# Patient Record
Sex: Male | Born: 1995
Health system: Southern US, Community
[De-identification: ages and names within clinical notes are randomized; demographics above are authoritative.]

---

## 2019-10-10 ENCOUNTER — Inpatient Hospital Stay (HOSPITAL_COMMUNITY)
Admission: EM | Admit: 2019-10-10 | Discharge: 2019-10-15 | DRG: 200 | Disposition: A | Discharge status: Home / Self Care | Payer: Self-pay | Attending: General Surgery | Admitting: General Surgery

## 2019-10-10 ENCOUNTER — Emergency Department (HOSPITAL_COMMUNITY): Payer: Self-pay

## 2019-10-10 DIAGNOSIS — J90 Pleural effusion, not elsewhere classified: Secondary | ICD-10-CM | POA: Diagnosis present

## 2019-10-10 DIAGNOSIS — T1490XA Injury, unspecified, initial encounter: Secondary | ICD-10-CM

## 2019-10-10 DIAGNOSIS — Q211 Atrial septal defect: Secondary | ICD-10-CM

## 2019-10-10 DIAGNOSIS — S21131A Puncture wound without foreign body of right front wall of thorax without penetration into thoracic cavity, initial encounter: Secondary | ICD-10-CM | POA: Diagnosis present

## 2019-10-10 DIAGNOSIS — I959 Hypotension, unspecified: Secondary | ICD-10-CM | POA: Diagnosis present

## 2019-10-10 DIAGNOSIS — Z20828 Contact with and (suspected) exposure to other viral communicable diseases: Secondary | ICD-10-CM | POA: Diagnosis present

## 2019-10-10 DIAGNOSIS — J939 Pneumothorax, unspecified: Secondary | ICD-10-CM

## 2019-10-10 DIAGNOSIS — J942 Hemothorax: Secondary | ICD-10-CM

## 2019-10-10 DIAGNOSIS — R0902 Hypoxemia: Secondary | ICD-10-CM | POA: Diagnosis present

## 2019-10-10 DIAGNOSIS — E669 Obesity, unspecified: Secondary | ICD-10-CM | POA: Diagnosis present

## 2019-10-10 DIAGNOSIS — S2241XA Multiple fractures of ribs, right side, initial encounter for closed fracture: Secondary | ICD-10-CM | POA: Diagnosis present

## 2019-10-10 DIAGNOSIS — W3400XA Accidental discharge from unspecified firearms or gun, initial encounter: Secondary | ICD-10-CM

## 2019-10-10 DIAGNOSIS — Z23 Encounter for immunization: Secondary | ICD-10-CM

## 2019-10-10 DIAGNOSIS — D62 Acute posthemorrhagic anemia: Secondary | ICD-10-CM | POA: Diagnosis present

## 2019-10-10 DIAGNOSIS — S271XXA Traumatic hemothorax, initial encounter: Secondary | ICD-10-CM

## 2019-10-10 DIAGNOSIS — S272XXA Traumatic hemopneumothorax, initial encounter: Principal | ICD-10-CM | POA: Diagnosis present

## 2019-10-10 LAB — CBC
HCT: 31 % — ABNORMAL LOW (ref 39.0–52.0)
Hemoglobin: 10 g/dL — ABNORMAL LOW (ref 13.0–17.0)
MCH: 29.3 pg (ref 26.0–34.0)
MCHC: 32.3 g/dL (ref 30.0–36.0)
MCV: 90.9 fL (ref 80.0–100.0)
Platelets: 237 10*3/uL (ref 150–400)
RBC: 3.41 MIL/uL — ABNORMAL LOW (ref 4.22–5.81)
RDW: 12.3 % (ref 11.5–15.5)
WBC: 11.7 10*3/uL — ABNORMAL HIGH (ref 4.0–10.5)
nRBC: 0 % (ref 0.0–0.2)

## 2019-10-10 LAB — I-STAT CHEM 8, ED
BUN: 16 mg/dL (ref 6–20)
Calcium, Ion: 1.18 mmol/L (ref 1.15–1.40)
Chloride: 102 mmol/L (ref 98–111)
Creatinine, Ser: 1.3 mg/dL — ABNORMAL HIGH (ref 0.61–1.24)
Glucose, Bld: 156 mg/dL — ABNORMAL HIGH (ref 70–99)
HCT: 31 % — ABNORMAL LOW (ref 39.0–52.0)
Hemoglobin: 10.5 g/dL — ABNORMAL LOW (ref 13.0–17.0)
Potassium: 3.7 mmol/L (ref 3.5–5.1)
Sodium: 140 mmol/L (ref 135–145)
TCO2: 25 mmol/L (ref 22–32)

## 2019-10-10 LAB — COMPREHENSIVE METABOLIC PANEL
ALT: 24 U/L (ref 0–44)
AST: 25 U/L (ref 15–41)
Albumin: 3.8 g/dL (ref 3.5–5.0)
Alkaline Phosphatase: 40 U/L (ref 38–126)
Anion gap: 11 (ref 5–15)
BUN: 13 mg/dL (ref 6–20)
CO2: 22 mmol/L (ref 22–32)
Calcium: 8.6 mg/dL — ABNORMAL LOW (ref 8.9–10.3)
Chloride: 106 mmol/L (ref 98–111)
Creatinine, Ser: 1.41 mg/dL — ABNORMAL HIGH (ref 0.61–1.24)
GFR calc Af Amer: >60 mL/min (ref >60)
GFR calc non Af Amer: >60 mL/min (ref >60)
Glucose, Bld: 169 mg/dL — ABNORMAL HIGH (ref 70–99)
Potassium: 3.7 mmol/L (ref 3.5–5.1)
Sodium: 139 mmol/L (ref 135–145)
Total Bilirubin: 0.8 mg/dL (ref 0.3–1.2)
Total Protein: 5.9 g/dL — ABNORMAL LOW (ref 6.5–8.1)

## 2019-10-10 LAB — PROTIME-INR
INR: 1.3 — ABNORMAL HIGH (ref 0.8–1.2)
Prothrombin Time: 16.2 seconds — ABNORMAL HIGH (ref 11.4–15.2)

## 2019-10-10 LAB — LACTIC ACID, PLASMA: Lactic Acid, Venous: 3.9 mmol/L (ref 0.5–1.9)

## 2019-10-10 LAB — ABO/RH: ABO/RH(D): O POS

## 2019-10-10 LAB — SARS CORONAVIRUS 2 BY RT PCR (HOSPITAL ORDER, PERFORMED IN ~~LOC~~ HOSPITAL LAB): SARS Coronavirus 2: NEGATIVE

## 2019-10-10 LAB — ETHANOL: Alcohol, Ethyl (B): <10 mg/dL (ref <10)

## 2019-10-10 MED ORDER — HYDROMORPHONE HCL 1 MG/ML IJ SOLN
INTRAMUSCULAR | Status: AC
  Filled 2019-10-10: qty 1

## 2019-10-10 MED ORDER — CEFAZOLIN SODIUM-DEXTROSE 2-4 GM/100ML-% IV SOLN
2.0000 g | Freq: Once | INTRAVENOUS | Status: AC
  Administered 2019-10-10: 2 g via INTRAVENOUS

## 2019-10-10 MED ORDER — IOHEXOL 300 MG/ML  SOLN
100.0000 mL | Freq: Once | INTRAMUSCULAR | Status: AC | PRN
  Administered 2019-10-10: 100 mL via INTRAVENOUS

## 2019-10-10 MED ORDER — ONDANSETRON HCL 4 MG/2ML IJ SOLN
INTRAMUSCULAR | Status: AC
  Administered 2019-10-10: 4 mg
  Filled 2019-10-10: qty 2

## 2019-10-10 MED ORDER — CEFAZOLIN SODIUM-DEXTROSE 1-4 GM/50ML-% IV SOLN: 1.0000 g | Freq: Once | INTRAVENOUS | Status: DC

## 2019-10-10 MED ORDER — HYDROMORPHONE HCL 1 MG/ML IJ SOLN
1.0000 mg | Freq: Once | INTRAMUSCULAR | Status: AC
  Administered 2019-10-10: 1 mg via INTRAVENOUS
  Filled 2019-10-10: qty 1

## 2019-10-10 MED ORDER — FENTANYL CITRATE (PF) 100 MCG/2ML IJ SOLN
50.0000 ug | Freq: Once | INTRAMUSCULAR | Status: AC | PRN
  Administered 2019-10-10: 50 ug via INTRAVENOUS

## 2019-10-10 MED ORDER — HYDROMORPHONE HCL 1 MG/ML IJ SOLN
INTRAMUSCULAR | Status: AC | PRN
  Administered 2019-10-10: 1 mg via INTRAVENOUS

## 2019-10-10 MED ORDER — FENTANYL CITRATE (PF) 100 MCG/2ML IJ SOLN
INTRAMUSCULAR | Status: AC
  Filled 2019-10-10: qty 2

## 2019-10-10 MED ORDER — FENTANYL CITRATE (PF) 100 MCG/2ML IJ SOLN
25.0000 ug | INTRAMUSCULAR | Status: DC | PRN
  Administered 2019-10-11 (×4): 50 ug via INTRAVENOUS
  Filled 2019-10-10 (×3): qty 2

## 2019-10-10 MED ORDER — TETANUS-DIPHTH-ACELL PERTUSSIS 5-2.5-18.5 LF-MCG/0.5 IM SUSP
0.5000 mL | Freq: Once | INTRAMUSCULAR | Status: AC
  Administered 2019-10-10: 0.5 mL via INTRAMUSCULAR

## 2019-10-10 MED ORDER — SODIUM CHLORIDE 0.9 % IV SOLN
INTRAVENOUS | Status: AC | PRN
  Administered 2019-10-10 (×2): 1000 mL via INTRAVENOUS

## 2019-10-10 NOTE — ED Notes (Signed)
Patient complaining of pain in lungs.  Patient to be medicated per orders.
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
## 2019-10-10 NOTE — ED Notes (Signed)
Patient vomited large amount of brown, food filled emesis.  Airway suctioned of emesis.  VO of 4mg  of Zofran by Dr Redmond Pulling.
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
## 2019-10-11 ENCOUNTER — Encounter (HOSPITAL_COMMUNITY): Payer: Self-pay

## 2019-10-11 ENCOUNTER — Other Ambulatory Visit: Payer: Self-pay

## 2019-10-11 ENCOUNTER — Inpatient Hospital Stay (HOSPITAL_COMMUNITY): Payer: Self-pay

## 2019-10-11 DIAGNOSIS — W3400XA Accidental discharge from unspecified firearms or gun, initial encounter: Secondary | ICD-10-CM

## 2019-10-11 DIAGNOSIS — S279XXA Injury of unspecified intrathoracic organ, initial encounter: Secondary | ICD-10-CM

## 2019-10-11 LAB — BPAM RBC
Blood Product Expiration Date: 202011082359
ISSUE DATE / TIME: 202010152221
Unit Type and Rh: 5100

## 2019-10-11 LAB — CBC
HCT: 28.6 % — ABNORMAL LOW (ref 39.0–52.0)
HCT: 25.8 % — ABNORMAL LOW (ref 39.0–52.0)
Hemoglobin: 9.8 g/dL — ABNORMAL LOW (ref 13.0–17.0)
Hemoglobin: 9.1 g/dL — ABNORMAL LOW (ref 13.0–17.0)
MCH: 30.1 pg (ref 26.0–34.0)
MCH: 30.1 pg (ref 26.0–34.0)
MCHC: 34.3 g/dL (ref 30.0–36.0)
MCHC: 35.3 g/dL (ref 30.0–36.0)
MCV: 87.7 fL (ref 80.0–100.0)
MCV: 85.4 fL (ref 80.0–100.0)
Platelets: 196 10*3/uL (ref 150–400)
Platelets: 167 10*3/uL (ref 150–400)
RBC: 3.26 MIL/uL — ABNORMAL LOW (ref 4.22–5.81)
RBC: 3.02 MIL/uL — ABNORMAL LOW (ref 4.22–5.81)
RDW: 12.7 % (ref 11.5–15.5)
RDW: 12.9 % (ref 11.5–15.5)
WBC: 22.8 10*3/uL — ABNORMAL HIGH (ref 4.0–10.5)
WBC: 12.9 10*3/uL — ABNORMAL HIGH (ref 4.0–10.5)
nRBC: 0 % (ref 0.0–0.2)
nRBC: 0 % (ref 0.0–0.2)

## 2019-10-11 LAB — COMPREHENSIVE METABOLIC PANEL
ALT: 23 U/L (ref 0–44)
AST: 34 U/L (ref 15–41)
Albumin: 3.4 g/dL — ABNORMAL LOW (ref 3.5–5.0)
Alkaline Phosphatase: 35 U/L — ABNORMAL LOW (ref 38–126)
Anion gap: 7 (ref 5–15)
BUN: 14 mg/dL (ref 6–20)
CO2: 23 mmol/L (ref 22–32)
Calcium: 7.8 mg/dL — ABNORMAL LOW (ref 8.9–10.3)
Chloride: 109 mmol/L (ref 98–111)
Creatinine, Ser: 1.11 mg/dL (ref 0.61–1.24)
GFR calc Af Amer: >60 mL/min (ref >60)
GFR calc non Af Amer: >60 mL/min (ref >60)
Glucose, Bld: 124 mg/dL — ABNORMAL HIGH (ref 70–99)
Potassium: 4.4 mmol/L (ref 3.5–5.1)
Sodium: 139 mmol/L (ref 135–145)
Total Bilirubin: 1.1 mg/dL (ref 0.3–1.2)
Total Protein: 5.2 g/dL — ABNORMAL LOW (ref 6.5–8.1)

## 2019-10-11 LAB — URINALYSIS, ROUTINE W REFLEX MICROSCOPIC
Bilirubin Urine: NEGATIVE
Glucose, UA: NEGATIVE mg/dL
Hgb urine dipstick: NEGATIVE
Ketones, ur: NEGATIVE mg/dL
Leukocytes,Ua: NEGATIVE
Nitrite: NEGATIVE
Protein, ur: NEGATIVE mg/dL
Specific Gravity, Urine: 1.034 — ABNORMAL HIGH (ref 1.005–1.030)
pH: 5 (ref 5.0–8.0)

## 2019-10-11 LAB — PROTIME-INR
INR: 1.3 — ABNORMAL HIGH (ref 0.8–1.2)
Prothrombin Time: 16.2 seconds — ABNORMAL HIGH (ref 11.4–15.2)

## 2019-10-11 LAB — TYPE AND SCREEN
ABO/RH(D): O POS
Antibody Screen: NEGATIVE
Unit division: 0

## 2019-10-11 LAB — MRSA PCR SCREENING: MRSA by PCR: NEGATIVE

## 2019-10-11 LAB — HIV ANTIBODY (ROUTINE TESTING W REFLEX): HIV Screen 4th Generation wRfx: NONREACTIVE

## 2019-10-11 LAB — APTT: aPTT: 24 seconds (ref 24–36)

## 2019-10-11 LAB — BLOOD PRODUCT ORDER (VERBAL) VERIFICATION

## 2019-10-11 MED ORDER — METHOCARBAMOL 1000 MG/10ML IJ SOLN
1000.0000 mg | Freq: Three times a day (TID) | INTRAVENOUS | Status: DC | PRN
  Filled 2019-10-11 (×2): qty 10

## 2019-10-11 MED ORDER — ONDANSETRON HCL 4 MG/2ML IJ SOLN
4.0000 mg | Freq: Four times a day (QID) | INTRAMUSCULAR | Status: DC | PRN
  Administered 2019-10-12 (×2): 4 mg via INTRAVENOUS
  Filled 2019-10-11 (×2): qty 2

## 2019-10-11 MED ORDER — ACETAMINOPHEN 325 MG PO TABS
650.0000 mg | ORAL_TABLET | Freq: Four times a day (QID) | ORAL | Status: DC
  Administered 2019-10-11 – 2019-10-15 (×15): 650 mg via ORAL
  Filled 2019-10-11 (×16): qty 2

## 2019-10-11 MED ORDER — ONDANSETRON 4 MG PO TBDP: 4.0000 mg | ORAL_TABLET | Freq: Four times a day (QID) | ORAL | Status: DC | PRN

## 2019-10-11 MED ORDER — PANTOPRAZOLE SODIUM 40 MG IV SOLR: 40.0000 mg | Freq: Every day | INTRAVENOUS | Status: DC

## 2019-10-11 MED ORDER — HYDROMORPHONE HCL 1 MG/ML IJ SOLN
1.0000 mg | INTRAMUSCULAR | Status: DC | PRN
  Administered 2019-10-11 – 2019-10-12 (×3): 1 mg via INTRAVENOUS
  Filled 2019-10-11 (×3): qty 1

## 2019-10-11 MED ORDER — PANTOPRAZOLE SODIUM 40 MG PO TBEC
40.0000 mg | DELAYED_RELEASE_TABLET | Freq: Every day | ORAL | Status: DC
  Administered 2019-10-11 – 2019-10-15 (×5): 40 mg via ORAL
  Filled 2019-10-11 (×5): qty 1

## 2019-10-11 MED ORDER — OXYCODONE HCL 5 MG PO TABS
5.0000 mg | ORAL_TABLET | ORAL | Status: DC | PRN
  Administered 2019-10-11 (×2): 5 mg via ORAL
  Administered 2019-10-11 – 2019-10-14 (×8): 10 mg via ORAL
  Filled 2019-10-11: qty 2
  Filled 2019-10-11: qty 1
  Filled 2019-10-11: qty 2
  Filled 2019-10-11: qty 1
  Filled 2019-10-11 (×7): qty 2

## 2019-10-11 MED ORDER — CHLORHEXIDINE GLUCONATE CLOTH 2 % EX PADS
6.0000 | MEDICATED_PAD | Freq: Every day | CUTANEOUS | Status: DC
  Administered 2019-10-11 – 2019-10-14 (×4): 6 via TOPICAL

## 2019-10-11 MED ORDER — POTASSIUM CHLORIDE IN NACL 20-0.9 MEQ/L-% IV SOLN
INTRAVENOUS | Status: DC
  Administered 2019-10-11 (×2): via INTRAVENOUS
  Filled 2019-10-11 (×2): qty 1000
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
## 2019-10-11 NOTE — Progress Notes (Signed)
Patient's belongings upon admission consisted of 1 cell phone.

## 2019-10-11 NOTE — Progress Notes (Signed)
Assisted tele visit to patient with family member.  Bartholomew Ramesh Anderson, RN   

## 2019-10-11 NOTE — Plan of Care (Signed)
Pt alert and oriented, complaints of pain but able to tolerate.  Will continue to monitor  Hiram Gash RN
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
## 2019-10-12 ENCOUNTER — Inpatient Hospital Stay (HOSPITAL_COMMUNITY): Payer: Self-pay

## 2019-10-12 LAB — CBC
HCT: 24.6 % — ABNORMAL LOW (ref 39.0–52.0)
Hemoglobin: 8.5 g/dL — ABNORMAL LOW (ref 13.0–17.0)
MCH: 29.9 pg (ref 26.0–34.0)
MCHC: 34.6 g/dL (ref 30.0–36.0)
MCV: 86.6 fL (ref 80.0–100.0)
Platelets: 168 10*3/uL (ref 150–400)
RBC: 2.84 MIL/uL — ABNORMAL LOW (ref 4.22–5.81)
RDW: 12.7 % (ref 11.5–15.5)
WBC: 12.4 10*3/uL — ABNORMAL HIGH (ref 4.0–10.5)
nRBC: 0 % (ref 0.0–0.2)
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
## 2019-10-12 NOTE — Progress Notes (Signed)
Assisted tele visit to patient with family member.  Shine Scrogham P, RN  
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
## 2019-10-12 NOTE — Progress Notes (Signed)
Assisted tele visit to patient with family member.  Jorge Greer P, RN  

## 2019-10-13 ENCOUNTER — Inpatient Hospital Stay (HOSPITAL_COMMUNITY): Payer: Self-pay

## 2019-10-13 LAB — CBC
HCT: 24.2 % — ABNORMAL LOW (ref 39.0–52.0)
Hemoglobin: 8.5 g/dL — ABNORMAL LOW (ref 13.0–17.0)
MCH: 30.2 pg (ref 26.0–34.0)
MCHC: 35.1 g/dL (ref 30.0–36.0)
MCV: 86.1 fL (ref 80.0–100.0)
Platelets: 173 10*3/uL (ref 150–400)
RBC: 2.81 MIL/uL — ABNORMAL LOW (ref 4.22–5.81)
RDW: 12.2 % (ref 11.5–15.5)
WBC: 11 10*3/uL — ABNORMAL HIGH (ref 4.0–10.5)
nRBC: 0 % (ref 0.0–0.2)
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
## 2019-10-13 NOTE — Progress Notes (Signed)
Assisted tele visit to patient with family member.  Daryus Sowash P, RN  

## 2019-10-13 NOTE — Progress Notes (Signed)
Assisted tele visit to patient with family member.  Jorge Greer P, RN  
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
## 2019-10-14 ENCOUNTER — Inpatient Hospital Stay (HOSPITAL_COMMUNITY): Payer: Self-pay

## 2019-10-14 LAB — CBC
HCT: 23.4 % — ABNORMAL LOW (ref 39.0–52.0)
Hemoglobin: 8.2 g/dL — ABNORMAL LOW (ref 13.0–17.0)
MCH: 29.9 pg (ref 26.0–34.0)
MCHC: 35 g/dL (ref 30.0–36.0)
MCV: 85.4 fL (ref 80.0–100.0)
Platelets: 219 10*3/uL (ref 150–400)
RBC: 2.74 MIL/uL — ABNORMAL LOW (ref 4.22–5.81)
RDW: 11.9 % (ref 11.5–15.5)
WBC: 9.1 10*3/uL (ref 4.0–10.5)
nRBC: 0 % (ref 0.0–0.2)

## 2019-10-14 LAB — BASIC METABOLIC PANEL
Anion gap: 9 (ref 5–15)
BUN: 11 mg/dL (ref 6–20)
CO2: 26 mmol/L (ref 22–32)
Calcium: 8.7 mg/dL — ABNORMAL LOW (ref 8.9–10.3)
Chloride: 104 mmol/L (ref 98–111)
Creatinine, Ser: 1.06 mg/dL (ref 0.61–1.24)
GFR calc Af Amer: >60 mL/min (ref >60)
GFR calc non Af Amer: >60 mL/min (ref >60)
Glucose, Bld: 100 mg/dL — ABNORMAL HIGH (ref 70–99)
Potassium: 3.8 mmol/L (ref 3.5–5.1)
Sodium: 139 mmol/L (ref 135–145)

## 2019-10-14 LAB — PHOSPHORUS: Phosphorus: 3.4 mg/dL (ref 2.5–4.6)

## 2019-10-14 LAB — MAGNESIUM: Magnesium: 1.7 mg/dL (ref 1.7–2.4)

## 2019-10-14 MED ORDER — DOCUSATE SODIUM 100 MG PO CAPS
100.0000 mg | ORAL_CAPSULE | Freq: Two times a day (BID) | ORAL | Status: DC
  Administered 2019-10-14 – 2019-10-15 (×2): 100 mg via ORAL
  Filled 2019-10-14 (×3): qty 1

## 2019-10-14 MED ORDER — ENOXAPARIN SODIUM 40 MG/0.4ML ~~LOC~~ SOLN
40.0000 mg | SUBCUTANEOUS | Status: DC
  Administered 2019-10-14: 40 mg via SUBCUTANEOUS
  Filled 2019-10-14 (×2): qty 0.4

## 2019-10-14 MED ORDER — METHOCARBAMOL 500 MG PO TABS
500.0000 mg | ORAL_TABLET | Freq: Three times a day (TID) | ORAL | Status: DC
  Administered 2019-10-14 – 2019-10-15 (×4): 500 mg via ORAL
  Filled 2019-10-14 (×4): qty 1

## 2019-10-14 MED ORDER — POLYETHYLENE GLYCOL 3350 17 G PO PACK
17.0000 g | PACK | Freq: Every day | ORAL | Status: DC
  Administered 2019-10-14: 17 g via ORAL
  Filled 2019-10-14 (×2): qty 1
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
## 2019-10-14 NOTE — Progress Notes (Signed)
Patient stated while trying to have BM he started to strain and it hurt his back. He took his stool softeners this morning and he was reeducated on drinking plenty of water to aid stool softeners. He stated he understood. Mother at bedside. Will continue to monitor. Katherina Right RN
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
## 2019-10-15 ENCOUNTER — Inpatient Hospital Stay (HOSPITAL_COMMUNITY): Payer: Self-pay

## 2019-10-15 LAB — BASIC METABOLIC PANEL
Anion gap: 8 (ref 5–15)
BUN: 13 mg/dL (ref 6–20)
CO2: 27 mmol/L (ref 22–32)
Calcium: 8.9 mg/dL (ref 8.9–10.3)
Chloride: 103 mmol/L (ref 98–111)
Creatinine, Ser: 0.9 mg/dL (ref 0.61–1.24)
GFR calc Af Amer: >60 mL/min (ref >60)
GFR calc non Af Amer: >60 mL/min (ref >60)
Glucose, Bld: 96 mg/dL (ref 70–99)
Potassium: 4.1 mmol/L (ref 3.5–5.1)
Sodium: 138 mmol/L (ref 135–145)

## 2019-10-15 LAB — CBC
HCT: 25.6 % — ABNORMAL LOW (ref 39.0–52.0)
Hemoglobin: 8.3 g/dL — ABNORMAL LOW (ref 13.0–17.0)
MCH: 28.9 pg (ref 26.0–34.0)
MCHC: 32.4 g/dL (ref 30.0–36.0)
MCV: 89.2 fL (ref 80.0–100.0)
Platelets: 251 10*3/uL (ref 150–400)
RBC: 2.87 MIL/uL — ABNORMAL LOW (ref 4.22–5.81)
RDW: 12.2 % (ref 11.5–15.5)
WBC: 8.2 10*3/uL (ref 4.0–10.5)
nRBC: 0 % (ref 0.0–0.2)

## 2019-10-15 LAB — MAGNESIUM: Magnesium: 1.8 mg/dL (ref 1.7–2.4)

## 2019-10-15 LAB — PHOSPHORUS: Phosphorus: 4.2 mg/dL (ref 2.5–4.6)

## 2019-10-15 MED ORDER — DOCUSATE SODIUM 100 MG PO CAPS: 100.0000 mg | ORAL_CAPSULE | Freq: Two times a day (BID) | ORAL | Status: AC | PRN

## 2019-10-15 MED ORDER — METHOCARBAMOL 500 MG PO TABS: 500.0000 mg | ORAL_TABLET | Freq: Three times a day (TID) | ORAL | 0 refills | Status: AC

## 2019-10-15 MED ORDER — OXYCODONE HCL 5 MG PO TABS: 5.0000 mg | ORAL_TABLET | Freq: Four times a day (QID) | ORAL | 0 refills | Status: AC | PRN

## 2019-10-15 MED ORDER — ACETAMINOPHEN 325 MG PO TABS: 650.0000 mg | ORAL_TABLET | Freq: Four times a day (QID) | ORAL | Status: AC | PRN

## 2019-10-15 MED FILL — oxyCODONE HCL 5 MG TABS: 5 | 5 days supply | Qty: 20 | Fill #0

## 2019-10-15 MED FILL — METHOCARBAMOL 500 MG TABS: 500 | 20 days supply | Qty: 60 | Fill #0
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
## 2019-10-15 NOTE — Progress Notes (Signed)
Chest tube removed per orders. Procedure explained to patient. Old dressing removed. Site, intact, clean and dry. Sutures removed. Pt inhaled and held breath while chest tube removed and occlusive dressing applied simultaneously. Patient tolerated well. Chest xray pending. Simmie Davies RN

## 2019-10-15 NOTE — Progress Notes (Signed)
Pt declines 3-N-1 commode per OT recommendation. Pt sated he has one at home from grand mother
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
## 2019-10-15 NOTE — Progress Notes (Signed)
Pt discharged to car with mother via wheelchair. Dressings dry and intact. Transitional pharmacy delivered medications. All belongings with patient. Simmie Davies RN
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
## 2019-10-30 ENCOUNTER — Other Ambulatory Visit (HOSPITAL_COMMUNITY): Payer: Self-pay | Admitting: General Surgery

## 2019-10-30 ENCOUNTER — Ambulatory Visit (HOSPITAL_COMMUNITY)
Admission: RE | Admit: 2019-10-30 | Discharge: 2019-10-30 | Disposition: A | Discharge status: Home / Self Care | Payer: Self-pay | Source: Ambulatory Visit | Attending: General Surgery | Admitting: General Surgery

## 2019-10-30 ENCOUNTER — Other Ambulatory Visit: Payer: Self-pay

## 2019-10-30 DIAGNOSIS — S270XXA Traumatic pneumothorax, initial encounter: Secondary | ICD-10-CM | POA: Insufficient documentation
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
